# Patient Record
Sex: Female | Born: 1985 | Race: Black or African American | Hispanic: No | Marital: Single | Smoking: Never smoker
Health system: Southern US, Community
[De-identification: ages and names within clinical notes are randomized; demographics above are authoritative.]

## PROBLEM LIST (undated history)

## (undated) ENCOUNTER — Emergency Department (HOSPITAL_COMMUNITY): Payer: 59

## (undated) DIAGNOSIS — M303 Mucocutaneous lymph node syndrome [Kawasaki]: Secondary | ICD-10-CM

## (undated) DIAGNOSIS — F329 Major depressive disorder, single episode, unspecified: Secondary | ICD-10-CM

## (undated) DIAGNOSIS — F32A Depression, unspecified: Secondary | ICD-10-CM

## (undated) DIAGNOSIS — F419 Anxiety disorder, unspecified: Secondary | ICD-10-CM

---

## 2016-09-07 ENCOUNTER — Emergency Department (HOSPITAL_COMMUNITY)
Admission: EM | Admit: 2016-09-07 | Discharge: 2016-09-08 | Disposition: A | Discharge status: Home / Self Care | Payer: Medicaid Other | Attending: Emergency Medicine | Admitting: Emergency Medicine

## 2016-09-07 ENCOUNTER — Encounter (HOSPITAL_COMMUNITY): Payer: Self-pay | Admitting: Emergency Medicine

## 2016-09-07 ENCOUNTER — Emergency Department (HOSPITAL_COMMUNITY): Payer: Medicaid Other

## 2016-09-07 DIAGNOSIS — R079 Chest pain, unspecified: Secondary | ICD-10-CM | POA: Diagnosis not present

## 2016-09-07 DIAGNOSIS — Z791 Long term (current) use of non-steroidal anti-inflammatories (NSAID): Secondary | ICD-10-CM | POA: Diagnosis not present

## 2016-09-07 DIAGNOSIS — R0789 Other chest pain: Secondary | ICD-10-CM | POA: Diagnosis present

## 2016-09-07 HISTORY — DX: Depression, unspecified: F32.A

## 2016-09-07 HISTORY — DX: Mucocutaneous lymph node syndrome (kawasaki): M30.3

## 2016-09-07 HISTORY — DX: Anxiety disorder, unspecified: F41.9

## 2016-09-07 HISTORY — DX: Major depressive disorder, single episode, unspecified: F32.9

## 2016-09-07 LAB — BASIC METABOLIC PANEL
Anion gap: 8 (ref 5–15)
BUN: 14 mg/dL (ref 6–20)
CHLORIDE: 108 mmol/L (ref 101–111)
CO2: 22 mmol/L (ref 22–32)
CREATININE: 0.72 mg/dL (ref 0.44–1.00)
Calcium: 9.7 mg/dL (ref 8.9–10.3)
Glucose, Bld: 92 mg/dL (ref 65–99)
Potassium: 3.7 mmol/L (ref 3.5–5.1)
SODIUM: 138 mmol/L (ref 135–145)

## 2016-09-07 LAB — CBC
HCT: 41.8 % (ref 36.0–46.0)
Hemoglobin: 14.3 g/dL (ref 12.0–15.0)
MCH: 30.5 pg (ref 26.0–34.0)
MCHC: 34.2 g/dL (ref 30.0–36.0)
MCV: 89.1 fL (ref 78.0–100.0)
PLATELETS: 170 10*3/uL (ref 150–400)
RBC: 4.69 MIL/uL (ref 3.87–5.11)
RDW: 13.3 % (ref 11.5–15.5)
WBC: 8.2 10*3/uL (ref 4.0–10.5)

## 2016-09-07 LAB — I-STAT TROPONIN, ED: TROPONIN I, POC: 0 ng/mL (ref 0.00–0.08)

## 2016-09-07 NOTE — ED Provider Notes (Signed)
[facility]-EMERGENCY DEPT Provider Note   CSN: [id number] Arrival date & time: 09/07/16  2013 By signing my name below, I, Linus Galas, attest that this documentation has been prepared under the direction and in the presence of [employer], PA-C. Electronically Signed: Linus Galas, ED Scribe. 09/07/16. 10:38 PM.   History   Chief Complaint Chief Complaint  Patient presents with  . Chest Pain   The history is provided by the patient. No language interpreter was used.   HPI Comments: Joy Love is a 30 y.o. female who presents to the Emergency Department with a PMHx of Kawasaki Disease complaining of a sudden onset of central chest pain that began 2 hours, prior to arrival. Pt also reports HA earlier in the day. She describes her pain as a burning pressure. Pt states her pain radiates to her neck towards her left ear. No aggravating or alleviating factors. She has not taken any OTC medication for the pain. Pt denies any fevers, chills, SOB, numbness, paresthesia, abdominal pain, N/V/D, syncope, leg swelling, or any other symptoms at this time.  Pt dx with Kawasaki Disease at age 26. Pt was followed but a cardiologist in [place] until 04/2015. She denies any permanent damages as a result of this. Per patient, she has had a stress test, echocardiogram and EKG in the past that have been benign.   Past Medical History:  Diagnosis Date  . Anxiety   . Depression   . Kawasaki disease (HCC)    There are no active problems to display for this patient.  History reviewed. No pertinent surgical history.  OB History    No data available     Home Medications    Prior to Admission medications   Medication Sig Start Date End Date Taking? Authorizing Provider  ibuprofen (ADVIL,MOTRIN) 200 MG tablet Take 400 mg by mouth every 6 (six) hours as needed for headache, mild pain or moderate pain.   Yes Historical Provider, MD  famotidine (PEPCID) 20 MG tablet Take 1 tablet (20 mg total) by mouth  2 (two) times daily. 09/08/16   Antony Madura, PA-C   Family History No family history on file.  Social History Social History  Substance Use Topics  . Smoking status: Never Smoker  . Smokeless tobacco: Never Used  . Alcohol use No   Allergies   Patient has no known allergies.  Review of Systems Review of Systems A complete 10 system review of systems was obtained and all systems are negative except as noted in the HPI and PMH.    Physical Exam Updated Vital Signs BP 121/73 (BP Location: Right Arm)   Pulse 70   Temp 98.2 F (36.8 C) (Oral)   Resp 23   Ht 5\' 3"  (1.6 m)   Wt 78.5 kg   LMP 08/08/2016 (Exact Date)   SpO2 100%   BMI 30.66 kg/m   Physical Exam  Constitutional: She is oriented to person, place, and time. She appears well-developed and well-nourished. No distress.  HENT:  Head: Normocephalic and atraumatic.  Eyes: Conjunctivae and EOM are normal. No scleral icterus.  Neck: Normal range of motion.  Cardiovascular: Normal rate, regular rhythm and intact distal pulses.   Pulmonary/Chest: Effort normal. No respiratory distress. She has no wheezes. She has no rales.  Musculoskeletal: Normal range of motion.  Neurological: She is alert and oriented to person, place, and time. She exhibits normal muscle tone. Coordination normal.  Skin: Skin is warm and dry. No rash noted. She is not  diaphoretic. No erythema. No pallor.  Psychiatric: She has a normal mood and affect. Her behavior is normal.  Nursing note and vitals reviewed.   ED Treatments / Results  DIAGNOSTIC STUDIES: Oxygen Saturation is 97% on room air, normal by my interpretation.    COORDINATION OF CARE: 10:31 PM Discussed treatment plan with pt at bedside and pt agreed to plan.  Labs (all labs ordered are listed, but only abnormal results are displayed) Labs Reviewed  BASIC METABOLIC PANEL  CBC  I-STAT TROPOININ, ED  I-STAT TROPOININ, ED   EKG  EKG Interpretation  Date/Time:  Saturday  September 07 2016 20:22:40 EST Ventricular Rate:  83 PR Interval:    QRS Duration: 69 QT Interval:  367 QTC Calculation: 432 R Axis:   74 Text Interpretation:  Sinus rhythm Baseline wander in lead(s) I No previous ECGs available Confirmed by [facility]-Knighton Medical CenterCHLOSSMAN MD, Denny PeonERIN ([phone]) on 09/08/2016 12:39:37 AM      Radiology Dg Chest 2 View  Result Date: 09/07/2016 CLINICAL DATA:  Acute onset of central chest tightness, radiating to the neck. Initial encounter. EXAM: CHEST  2 VIEW COMPARISON:  None. FINDINGS: The lungs are well-aerated and clear. There is no evidence of focal opacification, pleural effusion or pneumothorax. The heart is normal in size; the mediastinal contour is within normal limits. No acute osseous abnormalities are seen. IMPRESSION: No acute cardiopulmonary process seen. Electronically Signed   By: Roanna RaiderJeffery  Chang M.D.   On: 09/07/2016 21:24   Procedures Procedures (including critical care time)  Medications Ordered in ED Medications  gi cocktail (Maalox,Lidocaine,Donnatal) (30 mLs Oral Given 09/08/16 0059)    Initial Impression / Assessment and Plan / ED Course  I have reviewed the triage vital signs and the nursing notes.  Pertinent labs & imaging results that were available during my care of the patient were reviewed by me and considered in my medical decision making (see chart for details).  Clinical Course     30 year old female presents to the emergency department for evaluation of chest pain. Symptoms have been constant since onset and patient with negative troponin 2. EKG is reassuring and chest x-ray shows no acute cardiopulmonary abnormality. Patient with a history of Kawasaki's disease; however, she states that she does not know of any aneurysm that resulted because of this. She has been followed by a cardiologist in the past for chest pain and reports normal stress test and echocardiograms during prior workups. Will suspicion for cardiac etiology today. Heart score  consistent with low risk of acute coronary event. Symptoms also atypical for aortic dissection. Doubt pulmonary embolus and patient has no tachycardia or hypoxia. No recent surgeries or hospitalizations.  Question whether symptoms may be due to reflux as the patient describes her pain as "burning". The patient has been given a GI cocktail in the emergency department. Patient discharged with a prescription for Pepcid. Cardiology referral provided and return precautions discussed. Patient discharged in stable condition with no unaddressed concerns.   Final Clinical Impressions(s) / ED Diagnoses   Final diagnoses:  Chest pain, unspecified type    New Prescriptions Discharge Medication List as of 09/08/2016 12:44 AM    START taking these medications   Details  famotidine (PEPCID) 20 MG tablet Take 1 tablet (20 mg total) by mouth 2 (two) times daily., Starting Sun 09/08/2016, Print        I personally performed the services described in this documentation, which was scribed in my presence. The recorded information has been reviewed and is accurate.  Antony MaduraKelly Shanetra Blumenstock, PA-C 09/08/16 [phone]    Alvira MondayErin Schlossman, MD 09/09/16 2119

## 2016-09-07 NOTE — ED Triage Notes (Signed)
Pt reports central chest tightness beginning about two hours ago that radiates to her neck.  Denies numbness/tingling, chest pain, SOB, or N/V.  Reports dizziness earlier in the day.  Pt reports that she has had chest pain before 'but this feels different'.  Hx Kawasaki disease.

## 2016-09-08 LAB — I-STAT TROPONIN, ED: Troponin i, poc: 0 ng/mL (ref 0.00–0.08)

## 2016-09-08 MED ORDER — FAMOTIDINE 20 MG PO TABS: 20.0000 mg | ORAL_TABLET | Freq: Two times a day (BID) | ORAL | 1 refills | Status: DC

## 2016-09-08 MED ORDER — GI COCKTAIL ~~LOC~~
30.0000 mL | Freq: Once | ORAL | Status: AC
  Administered 2016-09-08: 30 mL via ORAL
  Filled 2016-09-08: qty 30

## 2016-09-08 NOTE — ED Notes (Signed)
Pt alert and oriented upon discharge. Ambulated without assistance to check out with family.

## 2017-01-31 ENCOUNTER — Encounter (HOSPITAL_COMMUNITY): Payer: Self-pay | Admitting: Emergency Medicine

## 2017-01-31 ENCOUNTER — Emergency Department (HOSPITAL_COMMUNITY)
Admission: EM | Admit: 2017-01-31 | Discharge: 2017-02-01 | Disposition: A | Discharge status: Home / Self Care | Payer: Medicaid Other | Attending: Emergency Medicine | Admitting: Emergency Medicine

## 2017-01-31 ENCOUNTER — Emergency Department (HOSPITAL_COMMUNITY): Payer: Medicaid Other

## 2017-01-31 DIAGNOSIS — Z79899 Other long term (current) drug therapy: Secondary | ICD-10-CM | POA: Diagnosis not present

## 2017-01-31 DIAGNOSIS — R079 Chest pain, unspecified: Secondary | ICD-10-CM | POA: Diagnosis present

## 2017-01-31 LAB — BASIC METABOLIC PANEL
Anion gap: 6 (ref 5–15)
BUN: 13 mg/dL (ref 6–20)
CALCIUM: 9.3 mg/dL (ref 8.9–10.3)
CO2: 23 mmol/L (ref 22–32)
CREATININE: 0.72 mg/dL (ref 0.44–1.00)
Chloride: 109 mmol/L (ref 101–111)
GFR calc Af Amer: >60 mL/min (ref >60)
GFR calc non Af Amer: >60 mL/min (ref >60)
GLUCOSE: 105 mg/dL — AB (ref 65–99)
Potassium: 3.5 mmol/L (ref 3.5–5.1)
Sodium: 138 mmol/L (ref 135–145)

## 2017-01-31 LAB — I-STAT TROPONIN, ED: TROPONIN I, POC: 0 ng/mL (ref 0.00–0.08)

## 2017-01-31 LAB — CBC
HCT: 39.5 % (ref 36.0–46.0)
Hemoglobin: 13.2 g/dL (ref 12.0–15.0)
MCH: 29.9 pg (ref 26.0–34.0)
MCHC: 33.4 g/dL (ref 30.0–36.0)
MCV: 89.4 fL (ref 78.0–100.0)
PLATELETS: 176 10*3/uL (ref 150–400)
RBC: 4.42 MIL/uL (ref 3.87–5.11)
RDW: 13.3 % (ref 11.5–15.5)
WBC: 7.8 10*3/uL (ref 4.0–10.5)

## 2017-01-31 MED ORDER — OMEPRAZOLE 20 MG PO CPDR: 20.0000 mg | DELAYED_RELEASE_CAPSULE | Freq: Every day | ORAL | 0 refills | Status: DC

## 2017-01-31 NOTE — ED Provider Notes (Signed)
[facility] DEPT Provider Note   CSN: [id number] Arrival date & time: 01/31/17  2054    By signing my name below, I, Valentino Saxon, attest that this documentation has been prepared under the direction and in the presence of Benjiman Core, MD. Electronically Signed: Valentino Saxon, ED Scribe. 01/31/17. 12:00 AM.  History   Chief Complaint Chief Complaint  Patient presents with  . Chest Pain   The history is provided by the patient. No language interpreter was used.   HPI Comments: Joy Love is a 31 y.o. female with PMHx of Kawasaki disease, anxiety and depression, who presents to the Emergency Department complaining of 8/10, moderate, constant, non-radiating mid-chest pain onset earlier today at ~3pm. Per pt, she reports clocking in for work when she began to experience sudden chest pains. She describes her pain a "squeezing" sensation. Pt notes having h/o of chest pain that typically last 3-5 minutes at a time. Per pt, she reports having EKG's and stress test done in the past showing normal readings/results. No alleviating factors noted. She denies abdominal pain, nausea, vomiting, diarrhea, leg swelling. She denies smoking tobacco or using illicit drugs.   Past Medical History:  Diagnosis Date  . Anxiety   . Depression   . Kawasaki disease (HCC)     There are no active problems to display for this patient.   History reviewed. No pertinent surgical history.  OB History    No data available       Home Medications    Prior to Admission medications   Medication Sig Start Date End Date Taking? Authorizing Provider  famotidine (PEPCID) 20 MG tablet Take 1 tablet (20 mg total) by mouth 2 (two) times daily. 09/08/16   Antony Madura, PA-C  ibuprofen (ADVIL,MOTRIN) 200 MG tablet Take 400 mg by mouth every 6 (six) hours as needed for headache, mild pain or moderate pain.    Historical Provider, MD  omeprazole (PRILOSEC) 20 MG capsule Take 1 capsule (20 mg total) by  mouth daily. 01/31/17   Benjiman Core, MD    Family History No family history on file.  Social History Social History  Substance Use Topics  . Smoking status: Never Smoker  . Smokeless tobacco: Never Used  . Alcohol use No     Allergies   Patient has no known allergies.   Review of Systems Review of Systems  Cardiovascular: Negative for leg swelling.  Gastrointestinal: Negative for abdominal pain, diarrhea, nausea and vomiting.  All other systems reviewed and are negative.    Physical Exam Updated Vital Signs BP 124/87 (BP Location: Right Arm)   Pulse 67   Temp 98.3 F (36.8 C)   Resp 16   Ht  (1.626 m)   Wt 164 lb (74.4 kg)   LMP 01/27/2017   SpO2 98%   BMI 28.15 kg/m   Physical Exam  Constitutional: She appears well-developed.  HENT:  Head: Normocephalic.  Eyes: EOM are normal.  Neck: Neck supple.  Cardiovascular: Normal rate.  Exam reveals no gallop and no friction rub.   No murmur heard. Pulmonary/Chest: Effort normal.  Abdominal: Soft.  Musculoskeletal: Normal range of motion. She exhibits no edema.  Neurological: She is alert.  Skin: Skin is warm. Capillary refill takes less than 2 seconds.  Psychiatric: She has a normal mood and affect.     ED Treatments / Results   DIAGNOSTIC STUDIES: Oxygen Saturation is 100% on RA, normal by my interpretation.    COORDINATION OF CARE: 11:57 PM Discussed  treatment plan with pt at bedside which includes labs and EKG and pt agreed to plan.   Labs (all labs ordered are listed, but only abnormal results are displayed) Labs Reviewed  BASIC METABOLIC PANEL - Abnormal; Notable for the following:       Result Value   Glucose, Bld 105 (*)    All other components within normal limits  CBC  I-STAT TROPOININ, ED    EKG  EKG Interpretation  Date/Time:  Friday January 31 2017 21:00:54 EDT Ventricular Rate:  72 PR Interval:    QRS Duration: 91 QT Interval:  377 QTC Calculation: 413 R  Axis:   65 Text Interpretation:  Sinus rhythm No STEMI.  Confirmed by LONG MD, JOSHUA [phone]) on 01/31/2017 9:28:43 PM Also confirmed by Rubin Payor  MD, Kiyona Mcnall [phone])  on 01/31/2017 11:49:13 PM       Radiology Dg Chest 2 View  Result Date: 01/31/2017 CLINICAL DATA:  Chest pain EXAM: CHEST  2 VIEW COMPARISON:  09/07/2016 chest radiograph. FINDINGS: Stable cardiomediastinal silhouette with normal heart size. No pneumothorax. No pleural effusion. Lungs appear clear, with no acute consolidative airspace disease and no pulmonary edema. IMPRESSION: No active cardiopulmonary disease. Electronically Signed   By: Delbert Phenix M.D.   On: 01/31/2017 21:20    Procedures Procedures (including critical care time)  Medications Ordered in ED Medications  gi cocktail (Maalox,Lidocaine,Donnatal) (30 mLs Oral Given 02/01/17 0018)     Initial Impression / Assessment and Plan / ED Course  I have reviewed the triage vital signs and the nursing notes.  Pertinent labs & imaging results that were available during my care of the patient were reviewed by me and considered in my medical decision making (see chart for details).     Patient with chest pain. Shortness of breath. Labs and x-ray reassuring. Will discharge home.  Final Clinical Impressions(s) / ED Diagnoses   Final diagnoses:  Nonspecific chest pain    New Prescriptions Discharge Medication List as of 01/31/2017 11:59 PM    START taking these medications   Details  omeprazole (PRILOSEC) 20 MG capsule Take 1 capsule (20 mg total) by mouth daily., Starting Fri 01/31/2017, Print        I personally performed the services described in this documentation, which was scribed in my presence. The recorded information has been reviewed and is accurate.       Benjiman Core, MD 02/01/17 [phone]

## 2017-01-31 NOTE — ED Triage Notes (Signed)
Patient c/o constant central chest pain with SOB since 1500 today. Describes pain as "squeezing." Denies pain radiation, abdominal pain, N/V/D.

## 2017-02-01 MED ORDER — GI COCKTAIL ~~LOC~~
30.0000 mL | Freq: Once | ORAL | Status: AC
  Administered 2017-02-01: 30 mL via ORAL
  Filled 2017-02-01: qty 30

## 2017-02-01 NOTE — ED Notes (Signed)
Pt request pain med to be administered here prior to d/c despite RN explaining their is a 24hr pharmacy available to fulfill her Rx. Will discuss w/ MD.

## 2017-09-08 ENCOUNTER — Ambulatory Visit: Payer: Medicaid Other | Admitting: Obstetrics & Gynecology

## 2017-09-08 ENCOUNTER — Other Ambulatory Visit (HOSPITAL_COMMUNITY)
Admission: RE | Admit: 2017-09-08 | Discharge: 2017-09-08 | Disposition: A | Discharge status: Home / Self Care | Payer: Medicaid Other | Source: Ambulatory Visit | Attending: Obstetrics & Gynecology | Admitting: Obstetrics & Gynecology

## 2017-09-08 ENCOUNTER — Encounter: Payer: Self-pay | Admitting: Obstetrics & Gynecology

## 2017-09-08 VITALS — BP 129/81 | HR 84 | Wt 162.3 lb

## 2017-09-08 DIAGNOSIS — F329 Major depressive disorder, single episode, unspecified: Secondary | ICD-10-CM | POA: Insufficient documentation

## 2017-09-08 DIAGNOSIS — R1033 Periumbilical pain: Secondary | ICD-10-CM | POA: Diagnosis present

## 2017-09-08 DIAGNOSIS — Z79899 Other long term (current) drug therapy: Secondary | ICD-10-CM | POA: Insufficient documentation

## 2017-09-08 DIAGNOSIS — Z0001 Encounter for general adult medical examination with abnormal findings: Secondary | ICD-10-CM | POA: Diagnosis not present

## 2017-09-08 DIAGNOSIS — Z01419 Encounter for gynecological examination (general) (routine) without abnormal findings: Secondary | ICD-10-CM

## 2017-09-08 DIAGNOSIS — Z0000 Encounter for general adult medical examination without abnormal findings: Secondary | ICD-10-CM

## 2017-09-08 DIAGNOSIS — F419 Anxiety disorder, unspecified: Secondary | ICD-10-CM | POA: Diagnosis not present

## 2017-09-08 LAB — POCT PREGNANCY, URINE: Preg Test, Ur: NEGATIVE

## 2017-09-08 NOTE — Progress Notes (Signed)
Patient ID: Joy Love, female   DOB: 08-Dec-1985, 31 y.o.   MRN: [id number]  Chief Complaint  Patient presents with  . Abdominal Pain    HPI Joy Love is a 31 y.o. female.  Single G3 P2 A1 here today for evaluation of umbilcal pain. This first started after an [facility]01-17 [facility]. Surgical abortion. She is using OCPs for contraction. She has an u/s done 07-21-17 which was normal. She also needs a pap smear. HPI  Past Medical History:  Diagnosis Date  . Anxiety   . Depression   . Kawasaki disease (HCC)     No past surgical history on file.  No family history on file.  Social History Social History   Tobacco Use  . Smoking status: Never Smoker  . Smokeless tobacco: Never Used  Substance Use Topics  . Alcohol use: No  . Drug use: No    No Known Allergies  Current Outpatient Medications  Medication Sig Dispense Refill  . norgestimate-ethinyl estradiol (ESTARYLLA) 0.25-35 MG-MCG tablet Take 1 tablet daily by mouth.    . sertraline (ZOLOFT) 50 MG tablet Take 50 mg daily by mouth.     No current facility-administered medications for this visit.     Review of Systems Review of Systems  She reports normal bowel and bladder function. She has been abstinent since July 2018. [facility] [employer] Has had her flu vaccine.  Blood pressure 129/81, pulse 84, weight 162 lb 4.8 oz (73.6 kg), last menstrual period 04/17/2017.  Physical Exam Physical Exam  Breathing, conversing, and ambulating normally Well nourished, well hydrated Black female, no apparent distress Abd- benign, no hernias appreciated Vaginal discharge somewhat frothy, white Cervix appears normal    Data Reviewed U/s normal  Assessment   preventative care Umbilical pain     Plan    Pap with cotesting Rec Gardasil Refer to gen surg for umbilical pain       Joy Love C Joy Love 09/08/2017, 11:02 AM

## 2017-09-09 LAB — CYTOLOGY - PAP
BACTERIAL VAGINITIS: POSITIVE — AB
CHLAMYDIA, DNA PROBE: NEGATIVE
DIAGNOSIS: NEGATIVE
HPV (WINDOPATH): NOT DETECTED
NEISSERIA GONORRHEA: NEGATIVE
Trichomonas: POSITIVE — AB

## 2017-09-10 ENCOUNTER — Telehealth: Payer: Self-pay | Admitting: General Practice

## 2017-09-10 DIAGNOSIS — A599 Trichomoniasis, unspecified: Secondary | ICD-10-CM

## 2017-09-10 MED ORDER — METRONIDAZOLE 500 MG PO TABS
2000.0000 mg | ORAL_TABLET | Freq: Once | ORAL | 0 refills | Status: AC
Start: 2017-09-10 — End: 2017-09-10

## 2017-09-10 NOTE — Telephone Encounter (Signed)
Called patient and informed her of results, recommendations and medication available for pick up at pharmacy. Patient verbalized understanding & had no questions

## 2017-09-10 NOTE — Telephone Encounter (Signed)
-----   Message from Allie BossierMyra C Dove, MD sent at 09/10/2017  9:19 AM EST ----- She has BV and trich. She needs flagyl 2 grams once. Boyfriend/partners need to be treated prior to further sexual contact. Thanks

## 2017-12-01 ENCOUNTER — Ambulatory Visit (HOSPITAL_COMMUNITY): Payer: Medicaid Other | Admitting: Psychiatry

## 2018-02-18 ENCOUNTER — Ambulatory Visit: Payer: Self-pay | Admitting: Obstetrics & Gynecology

## 2018-03-18 ENCOUNTER — Ambulatory Visit (INDEPENDENT_AMBULATORY_CARE_PROVIDER_SITE_OTHER): Payer: Medicaid Other | Admitting: Obstetrics & Gynecology

## 2018-03-18 VITALS — BP 120/81 | HR 84 | Wt 164.3 lb

## 2018-03-18 DIAGNOSIS — Z3042 Encounter for surveillance of injectable contraceptive: Secondary | ICD-10-CM

## 2018-03-18 DIAGNOSIS — Z3202 Encounter for pregnancy test, result negative: Secondary | ICD-10-CM | POA: Diagnosis not present

## 2018-03-18 DIAGNOSIS — Z23 Encounter for immunization: Secondary | ICD-10-CM

## 2018-03-18 DIAGNOSIS — Z01812 Encounter for preprocedural laboratory examination: Secondary | ICD-10-CM

## 2018-03-18 DIAGNOSIS — Z0000 Encounter for general adult medical examination without abnormal findings: Secondary | ICD-10-CM

## 2018-03-18 LAB — POCT URINE PREGNANCY: PREG TEST UR: NEGATIVE

## 2018-03-18 MED ORDER — MEDROXYPROGESTERONE ACETATE 150 MG/ML IM SUSP
150.0000 mg | Freq: Once | INTRAMUSCULAR | Status: AC
  Administered 2018-03-18: 150 mg via INTRAMUSCULAR

## 2018-03-18 NOTE — Progress Notes (Signed)
   Subjective:    Patient ID: Joy Love, female    DOB: 02-03-1986, 32 y.o.   MRN: [id number]  HPI 32 yo P2 (9 and 2 yo kids) here today for STI testing, no symptoms. She also would like to restart depo provera. She took OCPs but would forget to take them. She has not had sex for at least 2 weeks.   Review of Systems     Objective:   Physical Exam Breathing, conversing, and ambulating normally Well nourished, well hydrated Blackfemale, no apparent distress Abd- benign      Assessment & Plan:  Preventative care- start Gardasil today, come back in 2 and 6 months Birth control- depo provera today STI testing per patient request

## 2018-03-18 NOTE — Addendum Note (Signed)
Addended by: Faythe Casa on: 03/18/2018 04:15 PM   Modules accepted: Orders

## 2018-03-19 LAB — POCT PREGNANCY, URINE: Preg Test, Ur: NEGATIVE

## 2018-03-19 LAB — HEPATITIS C ANTIBODY: Hep C Virus Ab: <0.1 s/co ratio (ref 0.0–0.9)

## 2018-03-19 LAB — HEPATITIS B SURFACE ANTIGEN: Hepatitis B Surface Ag: NEGATIVE

## 2018-03-19 LAB — RPR: RPR: NONREACTIVE

## 2018-03-19 LAB — HIV ANTIBODY (ROUTINE TESTING W REFLEX): HIV SCREEN 4TH GENERATION: NONREACTIVE

## 2018-04-15 ENCOUNTER — Other Ambulatory Visit (HOSPITAL_COMMUNITY)
Admission: RE | Admit: 2018-04-15 | Discharge: 2018-04-15 | Disposition: A | Discharge status: Home / Self Care | Payer: Medicaid Other | Source: Ambulatory Visit | Attending: Obstetrics and Gynecology | Admitting: Obstetrics and Gynecology

## 2018-04-15 ENCOUNTER — Ambulatory Visit (INDEPENDENT_AMBULATORY_CARE_PROVIDER_SITE_OTHER): Payer: Self-pay | Admitting: *Deleted

## 2018-04-15 ENCOUNTER — Encounter: Payer: Self-pay | Admitting: Obstetrics & Gynecology

## 2018-04-15 DIAGNOSIS — A5901 Trichomonal vulvovaginitis: Secondary | ICD-10-CM | POA: Insufficient documentation

## 2018-04-15 DIAGNOSIS — B373 Candidiasis of vulva and vagina: Secondary | ICD-10-CM | POA: Insufficient documentation

## 2018-04-15 DIAGNOSIS — Z113 Encounter for screening for infections with a predominantly sexual mode of transmission: Secondary | ICD-10-CM | POA: Insufficient documentation

## 2018-04-15 NOTE — Progress Notes (Signed)
I have reviewed the chart and agree with nursing staff's documentation of this patient's encounter.  Vonzella NippleJulie Wenzel, PA-C 04/15/2018 2:31 PM

## 2018-04-15 NOTE — Progress Notes (Signed)
Here for self swab for std's. States not aware of an exposure but wants to make sure everything is ok. Denies vaginal discharge or odor. Self swab obtained.

## 2018-04-16 LAB — CERVICOVAGINAL ANCILLARY ONLY
BACTERIAL VAGINITIS: NEGATIVE
CANDIDA VAGINITIS: POSITIVE — AB
CHLAMYDIA, DNA PROBE: NEGATIVE
Neisseria Gonorrhea: NEGATIVE
TRICH (WINDOWPATH): POSITIVE — AB

## 2018-04-17 ENCOUNTER — Other Ambulatory Visit: Payer: Self-pay | Admitting: Medical

## 2018-04-17 DIAGNOSIS — B379 Candidiasis, unspecified: Secondary | ICD-10-CM

## 2018-04-17 DIAGNOSIS — B9689 Other specified bacterial agents as the cause of diseases classified elsewhere: Secondary | ICD-10-CM

## 2018-04-17 DIAGNOSIS — N760 Acute vaginitis: Principal | ICD-10-CM

## 2018-04-17 DIAGNOSIS — A599 Trichomoniasis, unspecified: Secondary | ICD-10-CM

## 2018-04-17 MED ORDER — METRONIDAZOLE 500 MG PO TABS: 500.0000 mg | ORAL_TABLET | Freq: Two times a day (BID) | ORAL | 0 refills | Status: AC

## 2018-04-17 MED ORDER — FLUCONAZOLE 150 MG PO TABS: 150.0000 mg | ORAL_TABLET | Freq: Every day | ORAL | 0 refills | Status: DC

## 2018-04-20 ENCOUNTER — Encounter: Payer: Self-pay | Admitting: General Practice

## 2018-04-22 ENCOUNTER — Other Ambulatory Visit: Payer: Self-pay | Admitting: General Practice

## 2018-04-22 DIAGNOSIS — B379 Candidiasis, unspecified: Secondary | ICD-10-CM

## 2018-04-22 MED ORDER — FLUCONAZOLE 150 MG PO TABS: 150.0000 mg | ORAL_TABLET | Freq: Every day | ORAL | 0 refills | Status: AC

## 2018-05-20 ENCOUNTER — Ambulatory Visit (INDEPENDENT_AMBULATORY_CARE_PROVIDER_SITE_OTHER): Payer: Self-pay | Admitting: *Deleted

## 2018-05-20 VITALS — BP 114/70 | HR 75 | Ht 64.0 in | Wt 168.1 lb

## 2018-05-20 DIAGNOSIS — Z23 Encounter for immunization: Secondary | ICD-10-CM

## 2018-05-20 DIAGNOSIS — Z0000 Encounter for general adult medical examination without abnormal findings: Secondary | ICD-10-CM

## 2018-05-20 NOTE — Progress Notes (Signed)
Gardasil #2 injection administered as scheduled. Pt tolerated well. Next injection due by 09/16/18.

## 2018-06-10 ENCOUNTER — Ambulatory Visit (INDEPENDENT_AMBULATORY_CARE_PROVIDER_SITE_OTHER): Payer: Medicaid Other

## 2018-06-10 VITALS — BP 117/80 | HR 70 | Wt 168.5 lb

## 2018-06-10 DIAGNOSIS — Z3042 Encounter for surveillance of injectable contraceptive: Secondary | ICD-10-CM

## 2018-06-10 MED ORDER — MEDROXYPROGESTERONE ACETATE 150 MG/ML IM SUSP
150.0000 mg | Freq: Once | INTRAMUSCULAR | Status: AC
  Administered 2018-06-10: 150 mg via INTRAMUSCULAR

## 2018-06-10 NOTE — Progress Notes (Signed)
Joy Love here for Depo-Provera  Injection.  Injection administered without complication. Patient will return in 3 months for next injection.  Ralene BatheJeanetta Marlen Mollica, RN 06/10/2018  9:47 AM

## 2018-08-26 ENCOUNTER — Ambulatory Visit (INDEPENDENT_AMBULATORY_CARE_PROVIDER_SITE_OTHER): Payer: Self-pay | Admitting: General Practice

## 2018-08-26 VITALS — BP 117/91 | HR 75 | Ht 63.0 in | Wt 170.0 lb

## 2018-08-26 DIAGNOSIS — Z3042 Encounter for surveillance of injectable contraceptive: Secondary | ICD-10-CM | POA: Diagnosis present

## 2018-08-26 MED ORDER — MEDROXYPROGESTERONE ACETATE 150 MG/ML IM SUSP
150.0000 mg | Freq: Once | INTRAMUSCULAR | Status: AC
  Administered 2018-08-26: 150 mg via INTRAMUSCULAR

## 2018-08-26 NOTE — Progress Notes (Addendum)
Haliey States here for Depo-Provera  Injection.  Injection administered without complication. Patient will return in 3 months for next injection.  Marylynn Pearson, RN 08/26/2018  9:22 AM     I have reviewed the chart and documentation and agree with the plan of care. Nolene Bernheim, RN, MSN, NP-BC Nurse Practitioner, [facility] for [employer], [facility] 08/26/2018 1:18 PM

## 2018-09-16 ENCOUNTER — Ambulatory Visit: Payer: Medicaid Other

## 2018-09-23 ENCOUNTER — Ambulatory Visit: Payer: Medicaid Other

## 2018-11-11 ENCOUNTER — Ambulatory Visit (INDEPENDENT_AMBULATORY_CARE_PROVIDER_SITE_OTHER): Payer: Medicaid Other | Admitting: General Practice

## 2018-11-11 VITALS — BP 112/66 | HR 73 | Ht 63.0 in | Wt 172.0 lb

## 2018-11-11 DIAGNOSIS — Z3042 Encounter for surveillance of injectable contraceptive: Secondary | ICD-10-CM | POA: Diagnosis present

## 2018-11-11 MED ORDER — MEDROXYPROGESTERONE ACETATE 150 MG/ML IM SUSP
150.0000 mg | Freq: Once | INTRAMUSCULAR | Status: AC
  Administered 2018-11-11: 150 mg via INTRAMUSCULAR

## 2018-11-11 NOTE — Progress Notes (Signed)
Joy Love here for Depo-Provera  Injection.  Injection administered without complication. Patient will return in 3 months for next injection.  Marylynn Pearson, RN 11/11/2018  9:54 AM

## 2018-11-11 NOTE — Progress Notes (Signed)
I have reviewed this chart and agree with the RN/CMA assessment and management.    Jazzlynn Rawe C Anny Sayler, MD, FACOG Attending Physician, [facility]  

## 2021-10-19 ENCOUNTER — Inpatient Hospital Stay (HOSPITAL_COMMUNITY)
Admission: AD | Admit: 2021-10-19 | Discharge: 2021-10-20 | Disposition: A | Discharge status: Home / Self Care | Payer: 59 | Attending: Emergency Medicine | Admitting: Emergency Medicine

## 2021-10-19 ENCOUNTER — Inpatient Hospital Stay (HOSPITAL_COMMUNITY): Payer: 59

## 2021-10-19 ENCOUNTER — Other Ambulatory Visit: Payer: Self-pay

## 2021-10-19 ENCOUNTER — Encounter (HOSPITAL_COMMUNITY): Payer: Self-pay | Admitting: Obstetrics and Gynecology

## 2021-10-19 DIAGNOSIS — W01198A Fall on same level from slipping, tripping and stumbling with subsequent striking against other object, initial encounter: Secondary | ICD-10-CM | POA: Insufficient documentation

## 2021-10-19 DIAGNOSIS — M545 Low back pain, unspecified: Secondary | ICD-10-CM | POA: Insufficient documentation

## 2021-10-19 DIAGNOSIS — O26893 Other specified pregnancy related conditions, third trimester: Secondary | ICD-10-CM | POA: Diagnosis not present

## 2021-10-19 DIAGNOSIS — S0990XA Unspecified injury of head, initial encounter: Secondary | ICD-10-CM | POA: Insufficient documentation

## 2021-10-19 DIAGNOSIS — M25532 Pain in left wrist: Secondary | ICD-10-CM | POA: Insufficient documentation

## 2021-10-19 DIAGNOSIS — Z3A23 23 weeks gestation of pregnancy: Secondary | ICD-10-CM | POA: Insufficient documentation

## 2021-10-19 DIAGNOSIS — W19XXXA Unspecified fall, initial encounter: Secondary | ICD-10-CM

## 2021-10-19 DIAGNOSIS — O9A212 Injury, poisoning and certain other consequences of external causes complicating pregnancy, second trimester: Secondary | ICD-10-CM | POA: Insufficient documentation

## 2021-10-19 DIAGNOSIS — M7918 Myalgia, other site: Secondary | ICD-10-CM

## 2021-10-19 NOTE — MAU Note (Signed)
AT 515PM- SHE WAS WALKING DOWN HILL- WAS WET AND FELL BACKWARDS  PT HIT BACK OF HER HEAD ON GROUND - EARS ARE RINGING  HER BACK HURTS AND  TAILBONE , LEFT WRIST,  PNC WITH [facility] NO MEDS FOR ANY PAIN

## 2021-10-19 NOTE — MAU Provider Note (Signed)
Event Date/Time   First Provider Initiated Contact with Patient 10/19/21 1957     S Ms. Joy Love is a 35 y.o. [id number] patient who presents to MAU today with complaint of fall. Patient reports around 5:15pm she was walking downhill when she slipped and fell on her back. Reports she hit her head and is currently having headache and ringing in her ears. She denies loss of consciousness, dizziness, or vision changes. Reports during the fall she used her hands to brace herself and is also having left wrist pain. Her mother is present at bedside and says that when she was asked how old her son is the patient reported that he was 68 years old, however her son is 42 years old. When her mother asked her why she said the wrong age that patient says "I don't know". She denies contractions, vaginal bleeding, or leaking fluid. Endorses active fetal movement.   O BP 129/66 (BP Location: Right Arm)    Pulse 89    Temp 98.5 F (36.9 C) (Oral)    Resp 20    Ht 5\' 3"  (1.6 m)    Wt 92.7 kg    BMI 36.21 kg/m   Physical Exam Vitals and nursing note reviewed.  Constitutional:      General: She is not in acute distress.    Appearance: She is not ill-appearing.  Eyes:     Extraocular Movements: Extraocular movements intact.     Pupils: Pupils are equal, round, and reactive to light.  Cardiovascular:     Rate and Rhythm: Normal rate.  Pulmonary:     Effort: Pulmonary effort is normal.  Abdominal:     Palpations: Abdomen is soft.     Tenderness: There is no abdominal tenderness.     Comments: Gravid   Musculoskeletal:        General: Normal range of motion.     Cervical back: Normal range of motion.  Skin:    General: Skin is warm and dry.  Neurological:     General: No focal deficit present.     Mental Status: She is alert and oriented to person, place, and time.     Cranial Nerves: No cranial nerve deficit.     Motor: Motor function is intact. No weakness.     Coordination: Coordination is  intact. Coordination normal.     Gait: Gait is intact.  Psychiatric:        Mood and Affect: Mood normal.        Behavior: Behavior normal.        Thought Content: Thought content normal.        Judgment: Judgment normal.    FHT: 150 bpm via doppler  A Medical screening exam complete [redacted] weeks gestation of pregnancy Head trauma Fall  P Spoke with [place], PA at [facility]. Patient to be evaluated there. RROB RN may go over there for monitoring. Once cleared from trauma standpoint, patient may return back to MAU for OB concerns.     [facility], CNM 10/19/2021 7:58 PM

## 2021-10-19 NOTE — ED Provider Notes (Addendum)
Emergency Medicine Provider Triage Evaluation Note  Joy Love , a 35 y.o. female  was evaluated in triage.  Pt complains of a mechanical fall. Was walking down a hill and slipped in mud. States she hit her head, no loc. C/o head, neck, back, tailbone, left wrist.  Review of Systems  Positive: head, neck, back, tailbone, left wrist Negative: loc  Physical Exam  BP 128/81    Pulse 87    Temp 98.7 F (37.1 C) (Oral)    Resp 16    Ht 5\' 3"  (1.6 m)    Wt 92.7 kg    SpO2 96%    BMI 36.21 kg/m  Gen:   Awake, no distress   Resp:  Normal effort  MSK:   Moves extremities without difficulty  Other:  Left cervical paraspinous ttp, no facial droop, strength/sensation symmetric to bue/ble  Medical Decision Making  Medically screening exam initiated at 11:03 PM.  Appropriate orders placed.  Araya Brenner was informed that the remainder of the evaluation will be completed by another provider, this initial triage assessment does not replace that evaluation, and the importance of remaining in the ED until their evaluation is complete.     Laural Benes, PA-C 10/19/21 [id number]    2307, PA-C 10/19/21 2307    2308, MD 10/19/21 2308

## 2021-10-19 NOTE — ED Triage Notes (Signed)
Pt  brought from MAU. Baby cleared FHT. C/O mechanical fall down a hill falling backwards. NO LOC. C/o back head pain, ring in ears, neck pain, and buttocks.

## 2021-10-19 NOTE — MAU Note (Signed)
Report given to ED charge nurse 

## 2021-10-20 NOTE — ED Provider Notes (Signed)
[name] EMERGENCY DEPARTMENT Provider Note   CSN: [id number] Arrival date & time: 10/19/21  1743     History Chief Complaint  Patient presents with   Fall    Joy Love is a 35 y.o. female.  HPI  35 year old female with a history of anxiety, depression, Kawasaki's disease, who is currently [redacted] weeks pregnant presents to the emergency department today for evaluation after a fall.  Patient states she was walking down a hill when she slipped and fell backwards hitting her head.  At that time she had some confusion after her head trauma.  She was also complaining of some pain to the left side of the neck.  She also had some lower back pain and left wrist pain as well.  She denies any vaginal bleeding, leaking of fluid and reported positive fetal movement.  She was seen in the MAU prior to arrival and had fetal heart tones completed which were normal.  She was sent here for further evaluation  Past Medical History:  Diagnosis Date   Anxiety    Depression    Kawasaki disease (HCC)     There are no problems to display for this patient.   No past surgical history on file.   OB History     Gravida  5   Para  2   Term  2   Preterm      AB  2   Living  2      SAB      IAB      Ectopic      Multiple      Live Births  2           No family history on file.  Social History   Tobacco Use   Smoking status: Never   Smokeless tobacco: Never  Substance Use Topics   Alcohol use: No   Drug use: No    Home Medications Prior to Admission medications   Medication Sig Start Date End Date Taking? Authorizing Provider  fluconazole (DIFLUCAN) 150 MG tablet Take 1 tablet (150 mg total) by mouth daily. 04/22/18   Marny Lowenstein, PA-C  metroNIDAZOLE (FLAGYL) 500 MG tablet Take 1 tablet (500 mg total) by mouth 2 (two) times daily. 04/17/18   Marny Lowenstein, PA-C  norgestimate-ethinyl estradiol (ESTARYLLA) 0.25-35 MG-MCG tablet Take 1 tablet  daily by mouth.    [provider]  sertraline (ZOLOFT) 50 MG tablet Take 50 mg daily by mouth.    [provider]    Allergies    Patient has no known allergies.  Review of Systems   Review of Systems  Constitutional:  Negative for fever.  Eyes:  Negative for visual disturbance.  Respiratory:  Negative for shortness of breath.   Cardiovascular:  Negative for chest pain.  Gastrointestinal:  Negative for abdominal pain.  Genitourinary:  Negative for pelvic pain and vaginal bleeding.  Musculoskeletal:  Positive for back pain and neck pain.       Wrist pain  Neurological:        Head injury   Physical Exam Updated Vital Signs BP 126/73 (BP Location: Right Arm)    Pulse 91    Temp 97.7 F (36.5 C) (Oral)    Resp 17    Ht 5\' 3"  (1.6 m)    Wt 92.7 kg    SpO2 99%    BMI 36.21 kg/m   Physical Exam Vitals and nursing note reviewed.  Constitutional:  General: She is not in acute distress.    Appearance: She is well-developed.  HENT:     Head: Normocephalic and atraumatic.  Eyes:     Conjunctiva/sclera: Conjunctivae normal.  Neck:     Comments: Ccollar in place, ttp to the left cervical paraspinous muscles Cardiovascular:     Rate and Rhythm: Normal rate.  Pulmonary:     Effort: Pulmonary effort is normal.  Musculoskeletal:        General: Normal range of motion.     Cervical back: Neck supple.     Comments: TTP to the lumbar spine and to the left distal radius  Skin:    General: Skin is warm and dry.  Neurological:     Mental Status: She is alert.     Comments: Mental Status:  Alert, thought content appropriate, able to give a coherent history. Speech fluent without evidence of aphasia. Able to follow 2 step commands without difficulty.  Cranial Nerves:  II:  pupils equal, round, reactive to light III,IV, VI: ptosis not present, extra-ocular motions intact bilaterally  V,VII: smile symmetric, facial light touch sensation equal VIII: hearing grossly  normal to voice  X: uvula elevates symmetrically  XI: bilateral shoulder shrug symmetric and strong XII: midline tongue extension without fassiculations Motor:  Normal tone. 5/5 strength of BUE and BLE major muscle groups including strong and equal grip strength and dorsiflexion/plantar flexion Sensory: light touch normal in all extremities.      ED Results / Procedures / Treatments   Labs (all labs ordered are listed, but only abnormal results are displayed) Labs Reviewed - No data to display  EKG None  Radiology DG Wrist Complete Left  Result Date: 10/19/2021 CLINICAL DATA:  Slipped and fell EXAM: LEFT WRIST - COMPLETE 3+ VIEW COMPARISON:  None. FINDINGS: Frontal, oblique, lateral, and ulnar deviated views of the left wrist are obtained. No fracture, subluxation, or dislocation. Joint spaces are well preserved. Soft tissues are normal. IMPRESSION: 1. Unremarkable left wrist. Electronically Signed   By: Randa Ngo M.D.   On: 10/19/2021 23:24   CT Head Wo Contrast  Result Date: 10/20/2021 CLINICAL DATA:  Head trauma, moderate-severe; Neck trauma, dangerous injury mechanism (Age 27-64y). Pt brought from MAU. Baby cleared FHT. C/O mechanical fall down a hill falling backwards. NO LOC. C/o back head pain, ring in ears, neck pain, and buttocks. EXAM: CT HEAD WITHOUT CONTRAST CT CERVICAL SPINE WITHOUT CONTRAST TECHNIQUE: Multidetector CT imaging of the head and cervical spine was performed following the standard protocol without intravenous contrast. Multiplanar CT image reconstructions of the cervical spine were also generated. COMPARISON:  None. FINDINGS: CT HEAD FINDINGS Brain: No evidence of large-territorial acute infarction. No parenchymal hemorrhage. No mass lesion. No extra-axial collection. No mass effect or midline shift. No hydrocephalus. Basilar cisterns are patent. Vascular: No hyperdense vessel. Skull: No acute fracture or focal lesion. Sinuses/Orbits: Paranasal sinuses and  mastoid air cells are clear. The orbits are unremarkable. Other: None. CT CERVICAL SPINE FINDINGS Alignment: Reversal of normal cervical lordosis centered at the C4-C5 level. Skull base and vertebrae: No acute fracture. No aggressive appearing focal osseous lesion or focal pathologic process. Soft tissues and spinal canal: No prevertebral fluid or swelling. No visible canal hematoma. Upper chest: Unremarkable. Other: None. IMPRESSION: 1. No acute intracranial abnormality. 2. No acute displaced fracture or traumatic listhesis of the cervical spine. Electronically Signed   By: Iven Finn M.D.   On: 10/20/2021 00:03   CT Cervical Spine Wo Contrast  Result Date: 10/20/2021 CLINICAL DATA:  Head trauma, moderate-severe; Neck trauma, dangerous injury mechanism (Age 62-64y). Pt brought from MAU. Baby cleared FHT. C/O mechanical fall down a hill falling backwards. NO LOC. C/o back head pain, ring in ears, neck pain, and buttocks. EXAM: CT HEAD WITHOUT CONTRAST CT CERVICAL SPINE WITHOUT CONTRAST TECHNIQUE: Multidetector CT imaging of the head and cervical spine was performed following the standard protocol without intravenous contrast. Multiplanar CT image reconstructions of the cervical spine were also generated. COMPARISON:  None. FINDINGS: CT HEAD FINDINGS Brain: No evidence of large-territorial acute infarction. No parenchymal hemorrhage. No mass lesion. No extra-axial collection. No mass effect or midline shift. No hydrocephalus. Basilar cisterns are patent. Vascular: No hyperdense vessel. Skull: No acute fracture or focal lesion. Sinuses/Orbits: Paranasal sinuses and mastoid air cells are clear. The orbits are unremarkable. Other: None. CT CERVICAL SPINE FINDINGS Alignment: Reversal of normal cervical lordosis centered at the C4-C5 level. Skull base and vertebrae: No acute fracture. No aggressive appearing focal osseous lesion or focal pathologic process. Soft tissues and spinal canal: No prevertebral fluid or  swelling. No visible canal hematoma. Upper chest: Unremarkable. Other: None. IMPRESSION: 1. No acute intracranial abnormality. 2. No acute displaced fracture or traumatic listhesis of the cervical spine. Electronically Signed   By: Iven Finn M.D.   On: 10/20/2021 00:03    Procedures Procedures   Medications Ordered in ED Medications - No data to display  ED Course  I have reviewed the triage vital signs and the nursing notes.  Pertinent labs & imaging results that were available during my care of the patient were reviewed by me and considered in my medical decision making (see chart for details).    MDM Rules/Calculators/A&P                          35 year old female presents to the emergency department today for evaluation after a mechanical fall.  Slipped and fell backwards hitting her head.  Some confusion noted afterwards.  CT head and cervical spine x-ray left wrist negative.  Patient also had some mild midline lumbar tenderness.  She had 5 out of 5 strength in the lower extremities and denies any red flag signs or symptoms.  We discussed the pros/cons of obtaining an x-ray at this time and patient is agreeable  Imaging at this time.  She will return if her symptoms are worse.  She was already seen by me prior to her evaluation here and had fetal heart tones that were normal.  Feel she is appropriate for discharge home with supportive care and strict return precautions.  She voices understanding and is in agreement the plan.  All questions answered.  Patient stable for discharge.  Final Clinical Impression(s) / ED Diagnoses Final diagnoses:  Fall, initial encounter  Minor head injury, initial encounter  Musculoskeletal pain    Rx / DC Orders ED Discharge Orders     None        Rodney Booze, PA-C 10/20/21 0600    Veryl Speak, MD 10/21/21 [phone]

## 2021-10-20 NOTE — Discharge Instructions (Signed)
You may alternate taking Tylenol as needed for pain control. You may take [phone] mg of Tylenol every 6 hours. Do not exceed 4000 mg of Tylenol daily as this can lead to liver damage. You may use warm and cold compresses to help with your symptoms.   You may go to the MAU for any obstetrics concerns  Please follow up with your primary care provider within 5-7 days for re-evaluation of your symptoms. If you do not have a primary care provider, information for a healthcare clinic has been provided for you to make arrangements for follow up care. Please return to the emergency department for any new or worsening symptoms.

## 2021-10-20 NOTE — ED Notes (Signed)
Imaging reviewed by PIT PA Cournti. Cleared pt's c-spine. Pt able to remove C collar.

## 2022-09-28 IMAGING — CT CT HEAD W/O CM
4 series · 15 of 47 positions shown, 17 images · non-contrast
Comparison: None.

CLINICAL DATA: Head trauma, moderate-severe; Neck trauma, dangerous
injury mechanism (Age 16-64y). Pt brought from [facility]. Baby cleared
FHT. C/O mechanical fall down a hill falling backwards. NO LOC. C/o
back head pain, ring in ears, neck pain, and buttocks.

EXAM:
CT HEAD WITHOUT CONTRAST
CT CERVICAL SPINE WITHOUT CONTRAST
TECHNIQUE: Multidetector CT imaging of the head and cervical spine was
performed following the standard protocol without intravenous
contrast. Multiplanar CT image reconstructions of the cervical spine
were also generated.

[Series 3: head wo · axial · 0.41mm/px · z∈[-111,-6]mm · 7 of 29 slices shown, 9 images]
[im 4/29  brain]
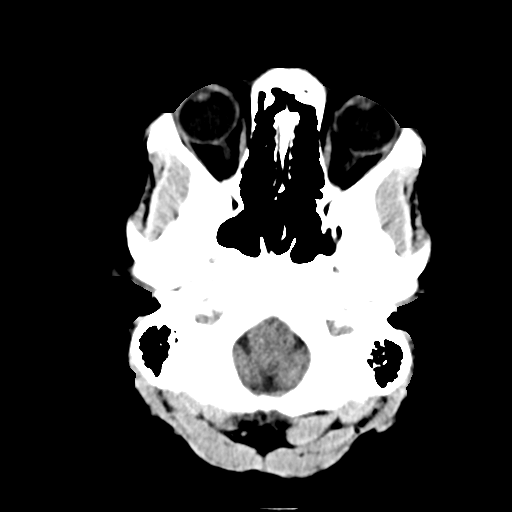
[im 4/29  bone]
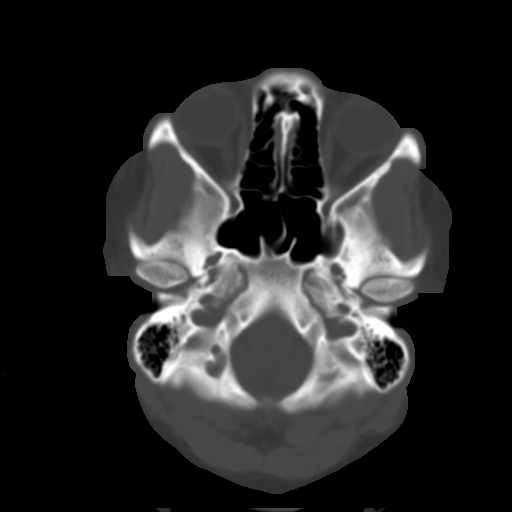
[im 8/29  brain]
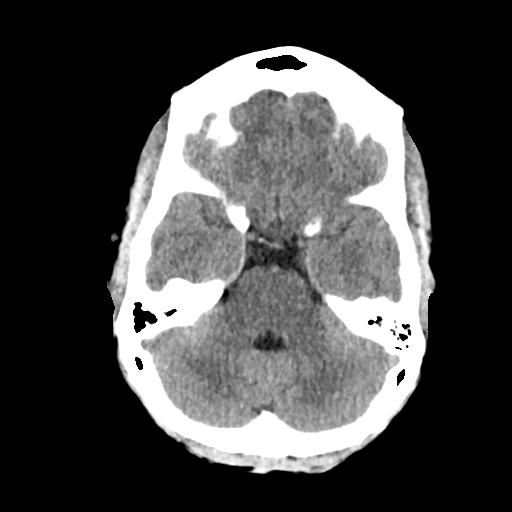
[im 11/29  brain]
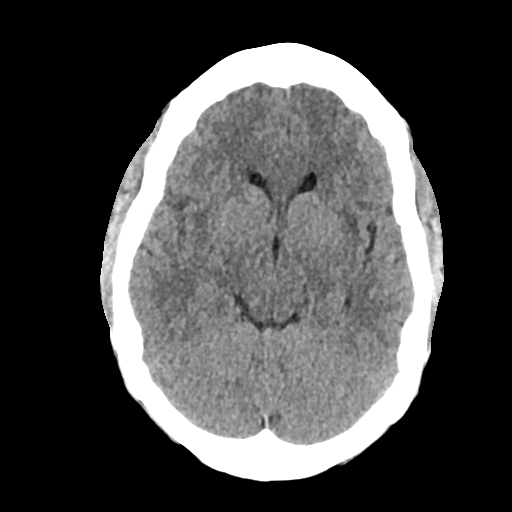
[im 15/29  brain]
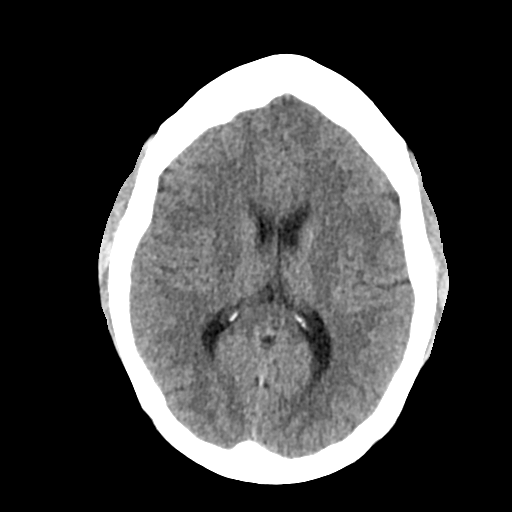
[im 18/29  brain]
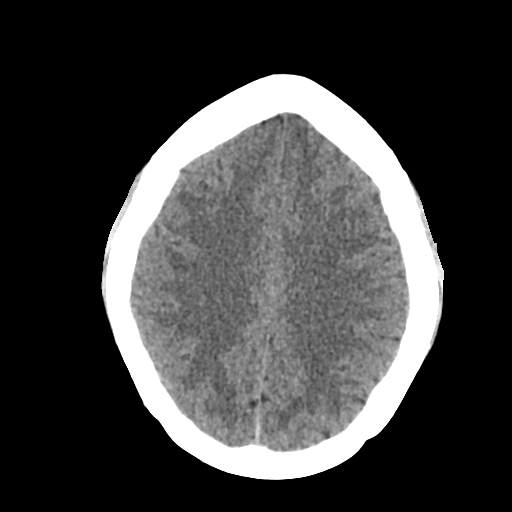
[im 18/29  bone]
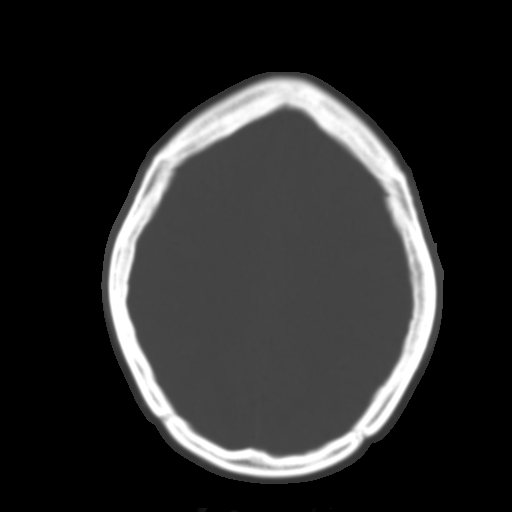
[im 22/29  brain]
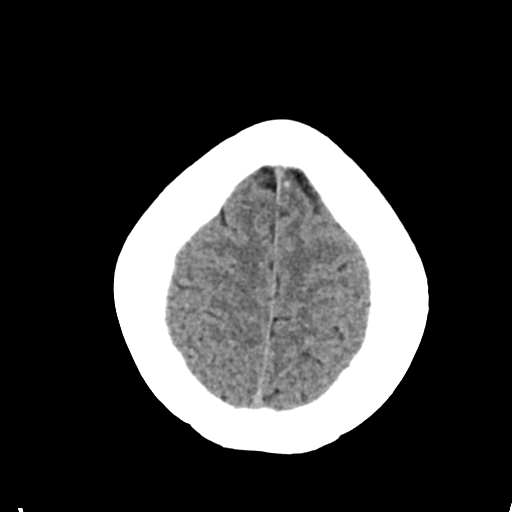
[im 25/29  brain]
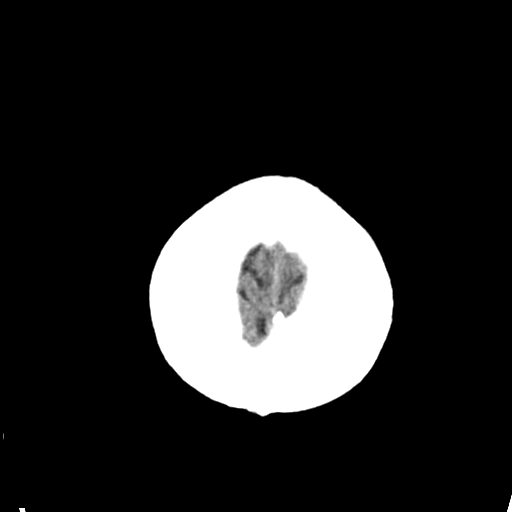

[Series 4: head bone · axial · 0.41mm/px · z∈[-112,-98]mm · 2 of 71 slices shown]
[im 8/71  bone]
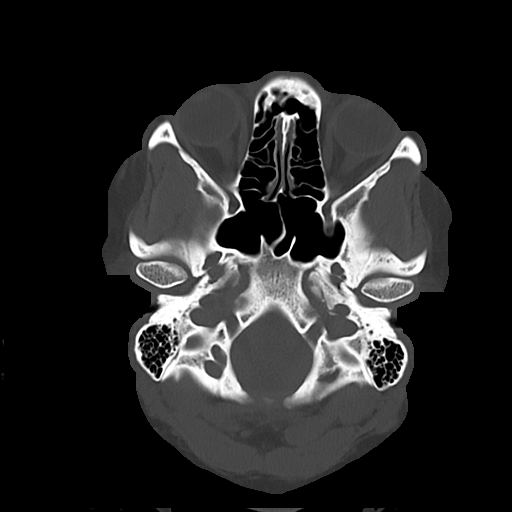
[im 15/71  bone]
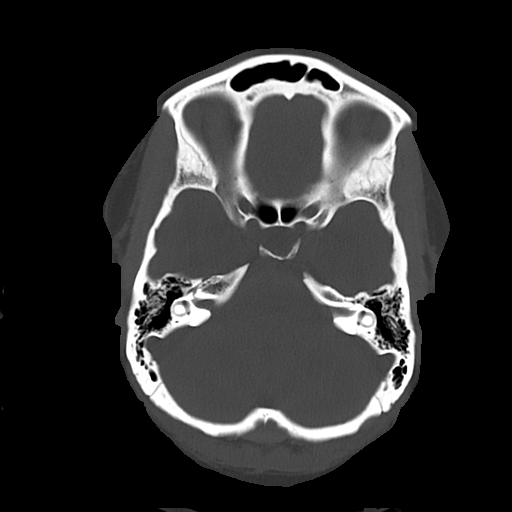

[Series 5: cor soft · coronal · 0.27mm/px · 3 of 75 slices shown]
[im 25/75  brain]
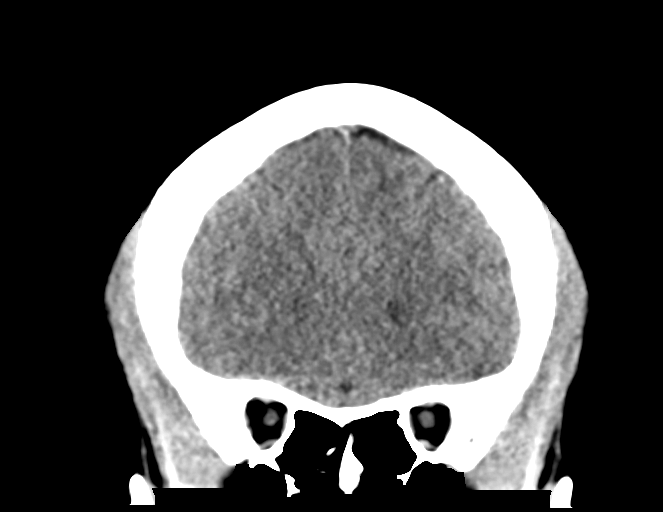
[im 33/75  brain]
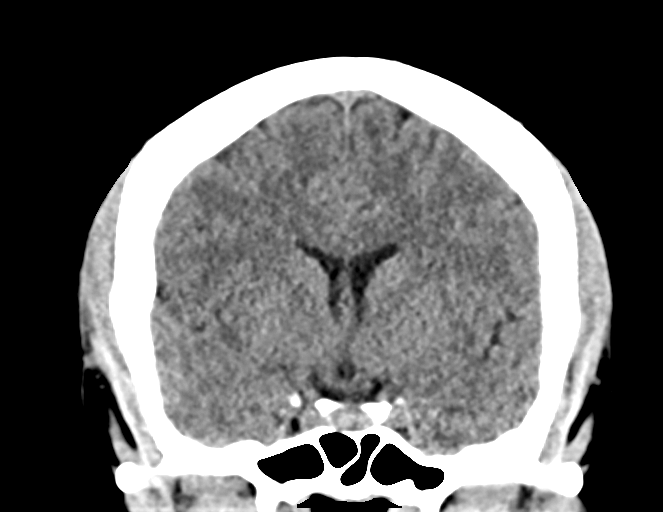
[im 42/75  brain]
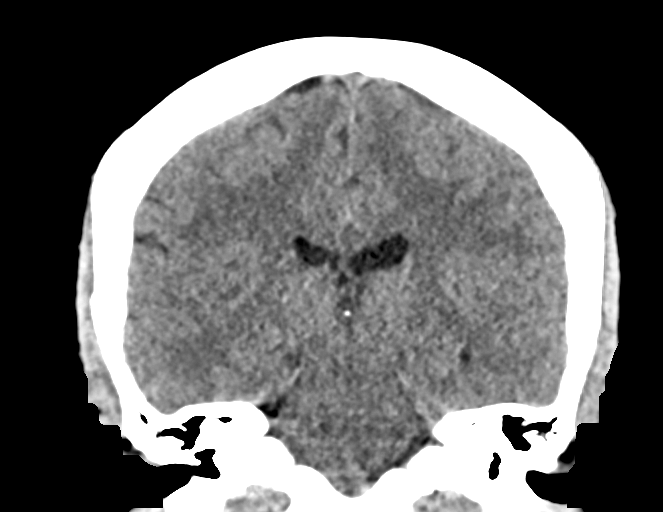

[Series 6: sag soft · sagittal · 0.29mm/px · 3 of 61 slices shown]
[im 21/61  brain]
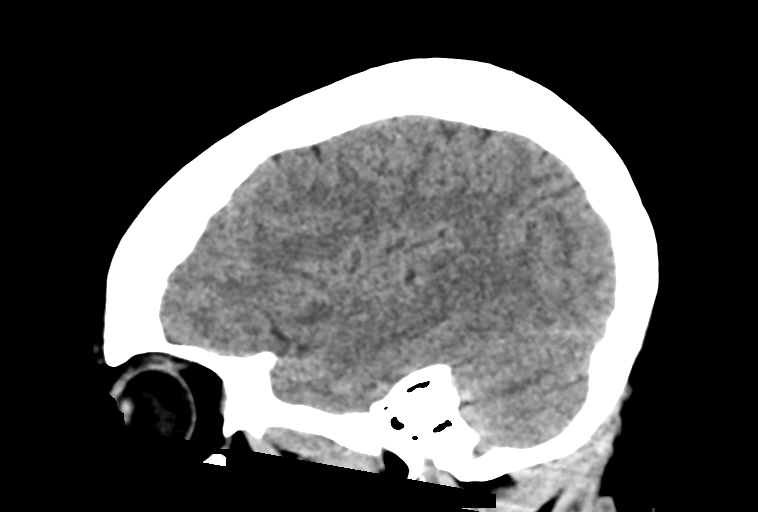
[im 31/61  brain]
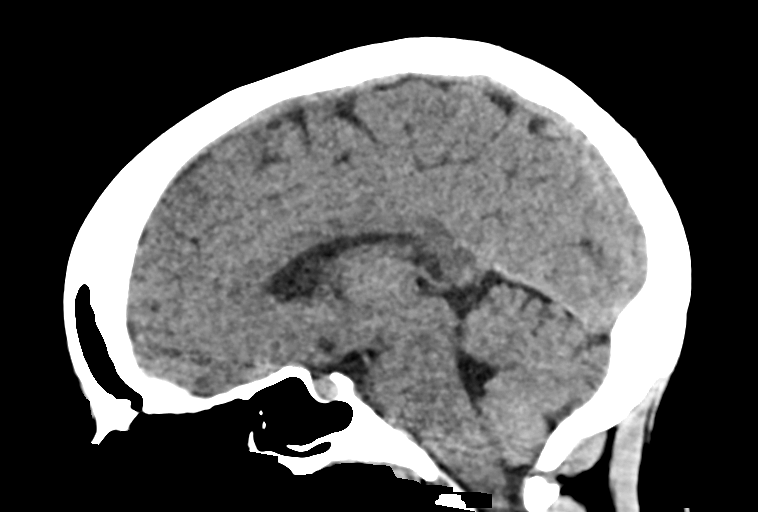
[im 41/61  brain]
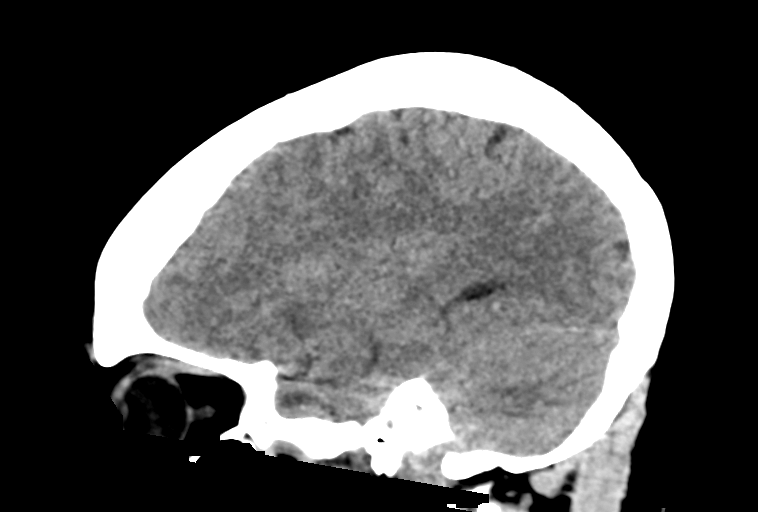

[15 of 47 positions shown; findings below may reference images not displayed]

FINDINGS: CT HEAD FINDINGS

Brain:

No evidence of large-territorial acute infarction. No parenchymal
hemorrhage. No mass lesion. No extra-axial collection.

No mass effect or midline shift. No hydrocephalus. Basilar cisterns
are patent.

Vascular: No hyperdense vessel.

Skull: No acute fracture or focal lesion.

Sinuses/Orbits: Paranasal sinuses and mastoid air cells are clear.
The orbits are unremarkable.

Other: None.

CT CERVICAL SPINE FINDINGS

Alignment: Reversal of normal cervical lordosis centered at the
C4-C5 level.

Skull base and vertebrae: No acute fracture. No aggressive appearing
focal osseous lesion or focal pathologic process.

Soft tissues and spinal canal: No prevertebral fluid or swelling. No
visible canal hematoma.

Upper chest: Unremarkable.

Other: None.
IMPRESSION: 1. No acute intracranial abnormality.
2. No acute displaced fracture or traumatic listhesis of the
cervical spine.
